# Patient Record
Sex: Female | Born: 1970 | Race: Black or African American | Hispanic: No | Marital: Married | Smoking: Never smoker
Health system: Southern US, Community
[De-identification: ages and names within clinical notes are randomized; demographics above are authoritative.]

## PROBLEM LIST (undated history)

## (undated) DIAGNOSIS — G4730 Sleep apnea, unspecified: Secondary | ICD-10-CM

## (undated) DIAGNOSIS — I2510 Atherosclerotic heart disease of native coronary artery without angina pectoris: Secondary | ICD-10-CM

## (undated) DIAGNOSIS — I10 Essential (primary) hypertension: Secondary | ICD-10-CM

## (undated) HISTORY — PX: TUBAL LIGATION: SHX77

## (undated) HISTORY — PX: DILATION AND CURETTAGE OF UTERUS: SHX78

## (undated) HISTORY — DX: Atherosclerotic heart disease of native coronary artery without angina pectoris: I25.10

---

## 2005-04-29 ENCOUNTER — Ambulatory Visit: Payer: Self-pay

## 2005-10-24 ENCOUNTER — Inpatient Hospital Stay: Payer: Self-pay | Admitting: Obstetrics and Gynecology

## 2010-12-15 ENCOUNTER — Ambulatory Visit: Payer: Self-pay | Admitting: Family Medicine

## 2011-01-20 ENCOUNTER — Emergency Department: Payer: Self-pay | Admitting: Unknown Physician Specialty

## 2012-05-03 ENCOUNTER — Ambulatory Visit: Payer: Self-pay | Admitting: Family Medicine

## 2012-12-02 ENCOUNTER — Emergency Department: Payer: Self-pay | Admitting: Unknown Physician Specialty

## 2013-12-19 ENCOUNTER — Emergency Department: Payer: Self-pay | Admitting: Internal Medicine

## 2014-04-30 ENCOUNTER — Emergency Department: Payer: Self-pay | Admitting: Emergency Medicine

## 2014-04-30 LAB — URINALYSIS, COMPLETE
BILIRUBIN, UR: NEGATIVE
Bacteria: NONE SEEN
Blood: NEGATIVE
Glucose,UR: NEGATIVE mg/dL (ref 0–75)
Ketone: NEGATIVE
LEUKOCYTE ESTERASE: NEGATIVE
NITRITE: NEGATIVE
Ph: 8 (ref 4.5–8.0)
RBC,UR: 4 /HPF (ref 0–5)
Specific Gravity: 1.017 (ref 1.003–1.030)
Squamous Epithelial: <1
WBC UR: 4 /HPF (ref 0–5)

## 2017-10-05 ENCOUNTER — Other Ambulatory Visit: Payer: Self-pay | Admitting: Obstetrics and Gynecology

## 2017-10-05 DIAGNOSIS — Z1231 Encounter for screening mammogram for malignant neoplasm of breast: Secondary | ICD-10-CM

## 2017-10-25 ENCOUNTER — Encounter: Payer: Self-pay | Admitting: Radiology

## 2017-10-25 ENCOUNTER — Ambulatory Visit
Admission: RE | Admit: 2017-10-25 | Discharge: 2017-10-25 | Disposition: A | Discharge status: Home / Self Care | Payer: Managed Care, Other (non HMO) | Source: Ambulatory Visit | Attending: Obstetrics and Gynecology | Admitting: Obstetrics and Gynecology

## 2017-10-25 DIAGNOSIS — Z1231 Encounter for screening mammogram for malignant neoplasm of breast: Secondary | ICD-10-CM | POA: Diagnosis present

## 2021-01-15 ENCOUNTER — Other Ambulatory Visit: Payer: Self-pay | Admitting: Family Medicine

## 2021-01-15 DIAGNOSIS — Z1231 Encounter for screening mammogram for malignant neoplasm of breast: Secondary | ICD-10-CM

## 2021-06-10 ENCOUNTER — Other Ambulatory Visit: Payer: Self-pay

## 2021-06-16 ENCOUNTER — Other Ambulatory Visit: Payer: Self-pay | Admitting: Gerontology

## 2021-10-23 ENCOUNTER — Encounter: Payer: Self-pay | Admitting: *Deleted

## 2021-10-26 ENCOUNTER — Ambulatory Visit: Payer: 59 | Admitting: Registered Nurse

## 2021-10-26 ENCOUNTER — Encounter
Admission: RE | Disposition: A | Discharge status: Home / Self Care | Payer: Self-pay | Source: Home / Self Care | Attending: Gastroenterology

## 2021-10-26 ENCOUNTER — Other Ambulatory Visit: Payer: Self-pay

## 2021-10-26 ENCOUNTER — Ambulatory Visit
Admission: RE | Admit: 2021-10-26 | Discharge: 2021-10-26 | Disposition: A | Discharge status: Home / Self Care | Payer: 59 | Attending: Gastroenterology | Admitting: Gastroenterology

## 2021-10-26 ENCOUNTER — Encounter: Payer: Self-pay | Admitting: *Deleted

## 2021-10-26 DIAGNOSIS — I10 Essential (primary) hypertension: Secondary | ICD-10-CM | POA: Insufficient documentation

## 2021-10-26 DIAGNOSIS — K640 First degree hemorrhoids: Secondary | ICD-10-CM | POA: Diagnosis not present

## 2021-10-26 DIAGNOSIS — Z1211 Encounter for screening for malignant neoplasm of colon: Secondary | ICD-10-CM | POA: Insufficient documentation

## 2021-10-26 DIAGNOSIS — G4730 Sleep apnea, unspecified: Secondary | ICD-10-CM | POA: Diagnosis not present

## 2021-10-26 HISTORY — PX: COLONOSCOPY WITH PROPOFOL: SHX5780

## 2021-10-26 HISTORY — DX: Sleep apnea, unspecified: G47.30

## 2021-10-26 HISTORY — DX: Essential (primary) hypertension: I10

## 2021-10-26 SURGERY — COLONOSCOPY WITH PROPOFOL
Anesthesia: General

## 2021-10-26 MED ORDER — PROPOFOL 500 MG/50ML IV EMUL
INTRAVENOUS | Status: DC | PRN
Start: 2021-10-26 — End: 2021-10-26
  Administered 2021-10-26: 140 ug/kg/min via INTRAVENOUS

## 2021-10-26 MED ORDER — SODIUM CHLORIDE 0.9 % IV SOLN: INTRAVENOUS | Status: DC

## 2021-10-26 MED ORDER — PROPOFOL 10 MG/ML IV BOLUS
INTRAVENOUS | Status: DC | PRN
  Administered 2021-10-26: 20 mg via INTRAVENOUS
  Administered 2021-10-26: 80 mg via INTRAVENOUS

## 2021-10-26 NOTE — Anesthesia Preprocedure Evaluation (Signed)
Anesthesia Evaluation  Patient identified by MRN, date of birth, ID band Patient awake    Reviewed: Allergy & Precautions, H&P , NPO status , Patient's Chart, lab work & pertinent test results, reviewed documented beta blocker date and time   History of Anesthesia Complications Negative for: history of anesthetic complications  Airway Mallampati: III  TM Distance: >3 FB Neck ROM: full    Dental  (+) Dental Advidsory Given, Teeth Intact, Missing   Pulmonary neg shortness of breath, sleep apnea and Continuous Positive Airway Pressure Ventilation , neg COPD, neg recent URI,    Pulmonary exam normal breath sounds clear to auscultation       Cardiovascular Exercise Tolerance: Good hypertension, (-) angina(-) Past MI and (-) Cardiac Stents Normal cardiovascular exam(-) dysrhythmias (-) Valvular Problems/Murmurs Rhythm:regular Rate:Normal     Neuro/Psych negative neurological ROS  negative psych ROS   GI/Hepatic negative GI ROS, Neg liver ROS,   Endo/Other  negative endocrine ROS  Renal/GU negative Renal ROS  negative genitourinary   Musculoskeletal   Abdominal   Peds  Hematology negative hematology ROS (+)   Anesthesia Other Findings Past Medical History: No date: Hypertension No date: Sleep apnea   Reproductive/Obstetrics negative OB ROS                             Anesthesia Physical Anesthesia Plan  ASA: 2  Anesthesia Plan: General   Post-op Pain Management:    Induction: Intravenous  PONV Risk Score and Plan: 3 and Propofol infusion and TIVA  Airway Management Planned: Natural Airway and Nasal Cannula  Additional Equipment:   Intra-op Plan:   Post-operative Plan:   Informed Consent: I have reviewed the patients History and Physical, chart, labs and discussed the procedure including the risks, benefits and alternatives for the proposed anesthesia with the patient or  authorized representative who has indicated his/her understanding and acceptance.     Dental Advisory Given  Plan Discussed with: Anesthesiologist, CRNA and Surgeon  Anesthesia Plan Comments:         Anesthesia Quick Evaluation

## 2021-10-26 NOTE — Op Note (Signed)
[facility] Gastroenterology Patient Name: Laura Rodgers Procedure Date: 10/26/2021 10:18 AM MRN: [id number] Account #: [id number] Date of Birth: Jun 30, 1971 Admit Type: Outpatient Age: 51 Room: [facility] ENDO ROOM 3 Gender: Female Note Status: Finalized Instrument Name: Jasper Riling [id number] Procedure:             Colonoscopy Indications:           Screening for colorectal malignant neoplasm Providers:             Andrey Farmer MD, MD Referring MD:          Caprice Renshaw MD (Referring MD) Medicines:             Monitored Anesthesia Care Complications:         No immediate complications. Procedure:             Pre-Anesthesia Assessment:                        - Prior to the procedure, a History and Physical was                         performed, and patient medications and allergies were                         reviewed. The patient is competent. The risks and                         benefits of the procedure and the sedation options and                         risks were discussed with the patient. All questions                         were answered and informed consent was obtained.                         Patient identification and proposed procedure were                         verified by the physician, the nurse, the                         anesthesiologist, the anesthetist and the technician                         in the endoscopy suite. Mental Status Examination:                         alert and oriented. Airway Examination: normal                         oropharyngeal airway and neck mobility. Respiratory                         Examination: clear to auscultation. CV Examination:                         normal. Prophylactic Antibiotics: The patient does not  require prophylactic antibiotics. Prior                         Anticoagulants: The patient has taken no previous                         anticoagulant or antiplatelet  agents. ASA Grade                         Assessment: II - A patient with mild systemic disease.                         After reviewing the risks and benefits, the patient                         was deemed in satisfactory condition to undergo the                         procedure. The anesthesia plan was to use monitored                         anesthesia care (MAC). Immediately prior to                         administration of medications, the patient was                         re-assessed for adequacy to receive sedatives. The                         heart rate, respiratory rate, oxygen saturations,                         blood pressure, adequacy of pulmonary ventilation, and                         response to care were monitored throughout the                         procedure. The physical status of the patient was                         re-assessed after the procedure.                        After obtaining informed consent, the colonoscope was                         passed under direct vision. Throughout the procedure,                         the patient's blood pressure, pulse, and oxygen                         saturations were monitored continuously. The                         Colonoscope was introduced through the anus and  advanced to the the cecum, identified by appendiceal                         orifice and ileocecal valve. The colonoscopy was                         performed without difficulty. The patient tolerated                         the procedure well. The quality of the bowel                         preparation was good. Findings:      The perianal and digital rectal examinations were normal.      Internal hemorrhoids were found during retroflexion. The hemorrhoids       were Grade I (internal hemorrhoids that do not prolapse).      The exam was otherwise without abnormality on direct and retroflexion       views. Impression:             - Internal hemorrhoids.                        - The examination was otherwise normal on direct and                         retroflexion views.                        - No specimens collected. Recommendation:        - Discharge patient to home.                        - Resume previous diet.                        - Continue present medications.                        - Repeat colonoscopy in 10 years for screening                         purposes.                        - Return to referring physician as previously                         scheduled. Procedure Code(s):     --- Professional ---                        S2876, Colorectal cancer screening; colonoscopy on                         individual not meeting criteria for high risk Diagnosis Code(s):     --- Professional ---                        Z12.11, Encounter for screening for malignant neoplasm  of colon                        K64.0, First degree hemorrhoids CPT copyright 2019 [employer]. All rights reserved. The codes documented in this report are preliminary and upon coder review may  be revised to meet current compliance requirements. Andrey Farmer MD, MD 10/26/2021 10:58:05 AM Number of Addenda: 0 Note Initiated On: 10/26/2021 10:18 AM Scope Withdrawal Time: 0 hours 9 minutes 53 seconds  Total Procedure Duration: 0 hours 16 minutes 36 seconds  Estimated Blood Loss:  Estimated blood loss: none.      [facility]

## 2021-10-26 NOTE — Transfer of Care (Signed)
Immediate Anesthesia Transfer of Care Note  Patient: Laura Rodgers  Procedure(s) Performed: COLONOSCOPY WITH PROPOFOL  Patient Location: PACU and Endoscopy Unit  Anesthesia Type:General  Level of Consciousness: drowsy and patient cooperative  Airway & Oxygen Therapy: Patient Spontanous Breathing  Post-op Assessment: Report given to RN and Post -op Vital signs reviewed and stable  Post vital signs: Reviewed and stable  Last Vitals:  Vitals Value Taken Time  BP 123/79 10/26/21 1100  Temp 36 C 10/26/21 1100  Pulse 78 10/26/21 1100  Resp 17 10/26/21 1100  SpO2 100 % 10/26/21 1100    Last Pain:  Vitals:   10/26/21 1100  TempSrc: Temporal  PainSc:          Complications: No notable events documented.

## 2021-10-26 NOTE — Anesthesia Postprocedure Evaluation (Signed)
Anesthesia Post Note  Patient: Laura Rodgers  Procedure(s) Performed: COLONOSCOPY WITH PROPOFOL  Patient location during evaluation: Endoscopy Anesthesia Type: General Level of consciousness: awake and alert Pain management: pain level controlled Vital Signs Assessment: post-procedure vital signs reviewed and stable Respiratory status: spontaneous breathing, nonlabored ventilation, respiratory function stable and patient connected to nasal cannula oxygen Cardiovascular status: blood pressure returned to baseline and stable Postop Assessment: no apparent nausea or vomiting Anesthetic complications: no   No notable events documented.   Last Vitals:  Vitals:   10/26/21 1120 10/26/21 1130  BP: 124/88 131/88  Pulse:  76  Resp: 16 16  Temp: (!) 36 C   SpO2: 100% 100%    Last Pain:  Vitals:   10/26/21 1120  TempSrc: Temporal  PainSc:                  Lenard Simmer

## 2021-10-26 NOTE — H&P (Signed)
Outpatient short stay form Pre-procedure 10/26/2021  Regis Bill, MD  Primary Physician: Kandyce Rud, MD  Reason for visit:  Screening colonoscopy  History of present illness:    51 y/o lady with history of hypertension here for screening colonoscopy. No family history of GI malignancies. No blood thinners. No significant abdominal surgeries.    Current Facility-Administered Medications:    0.9 %  sodium chloride infusion, , Intravenous, Continuous, Muaz Shorey, Rossie Muskrat, MD, Last Rate: 20 mL/hr at 10/26/21 1016, Continued from Pre-op at 10/26/21 1016  Medications Prior to Admission  Medication Sig Dispense Refill Last Dose   hydrochlorothiazide (HYDRODIURIL) 12.5 MG tablet Take 12.5 mg by mouth daily.   10/26/2021 at 0500   losartan (COZAAR) 50 MG tablet Take 50 mg by mouth daily.   10/26/2021 at 0500     Not on File   Past Medical History:  Diagnosis Date   Hypertension    Sleep apnea     Review of systems:  Otherwise negative.    Physical Exam  Gen: Alert, oriented. Appears stated age.  HEENT:PERRLA. Lungs: No respiratory distress CV: RRR Abd: soft, benign, no masses Ext: No edema    Planned procedures: Proceed with colonoscopy. The patient understands the nature of the planned procedure, indications, risks, alternatives and potential complications including but not limited to bleeding, infection, perforation, damage to internal organs and possible oversedation/side effects from anesthesia. The patient agrees and gives consent to proceed.  Please refer to procedure notes for findings, recommendations and patient disposition/instructions.     Regis Bill, MD [facility] Gastroenterology

## 2021-10-26 NOTE — Interval H&P Note (Signed)
History and Physical Interval Note:  10/26/2021 10:31 AM  Laura Rodgers  has presented today for surgery, with the diagnosis of colon cancer screening.  The various methods of treatment have been discussed with the patient and family. After consideration of risks, benefits and other options for treatment, the patient has consented to  Procedure(s): COLONOSCOPY WITH PROPOFOL (N/A) as a surgical intervention.  The patient's history has been reviewed, patient examined, no change in status, stable for surgery.  I have reviewed the patient's chart and labs.  Questions were answered to the patient's satisfaction.     Regis Bill  Ok to proceed with colonoscopy

## 2021-11-10 ENCOUNTER — Other Ambulatory Visit: Payer: Self-pay

## 2021-11-18 ENCOUNTER — Other Ambulatory Visit: Payer: Self-pay

## 2021-11-18 ENCOUNTER — Ambulatory Visit
Admission: RE | Admit: 2021-11-18 | Discharge: 2021-11-18 | Disposition: A | Discharge status: Home / Self Care | Payer: 59 | Source: Ambulatory Visit | Attending: Family Medicine | Admitting: Family Medicine

## 2021-11-18 DIAGNOSIS — Z1231 Encounter for screening mammogram for malignant neoplasm of breast: Secondary | ICD-10-CM | POA: Insufficient documentation

## 2022-01-08 ENCOUNTER — Other Ambulatory Visit: Payer: Self-pay

## 2022-03-03 ENCOUNTER — Other Ambulatory Visit: Payer: Self-pay

## 2022-06-03 ENCOUNTER — Other Ambulatory Visit: Payer: Self-pay

## 2022-09-29 IMAGING — MG MM DIGITAL SCREENING BILAT W/ TOMO AND CAD
8 series · 8 of 24 positions shown · non-contrast
Comparison: Previous exam(s).

CLINICAL DATA: Screening.

EXAM:
DIGITAL SCREENING BILATERAL MAMMOGRAM WITH TOMOSYNTHESIS AND CAD
TECHNIQUE: Bilateral screening digital craniocaudal and mediolateral oblique
mammograms were obtained. Bilateral screening digital breast
tomosynthesis was performed. The images were evaluated with
computer-aided detection.

[R CC synth-2D]
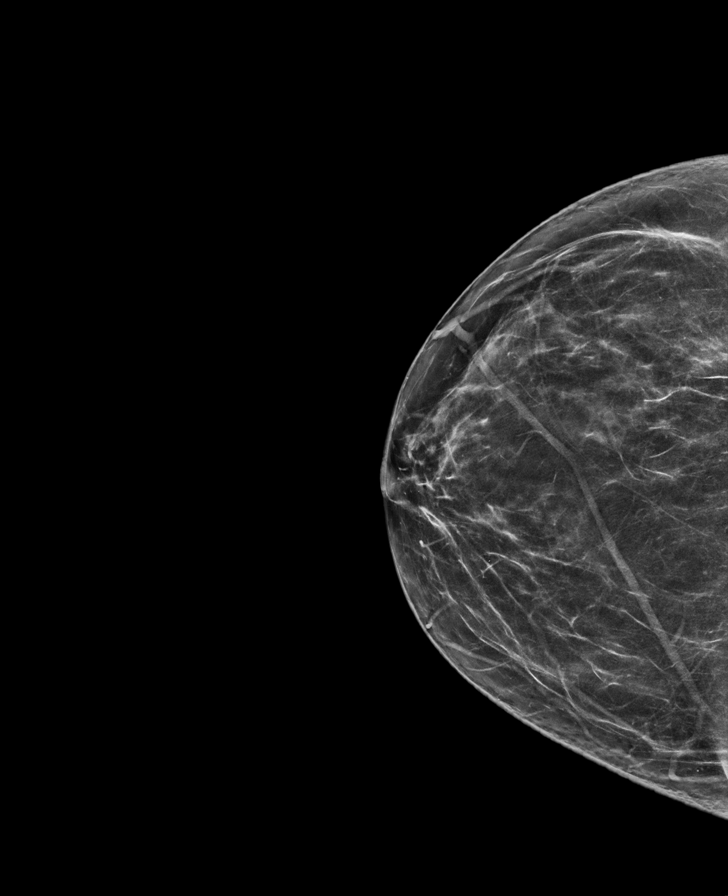

[L MLO synth-2D]
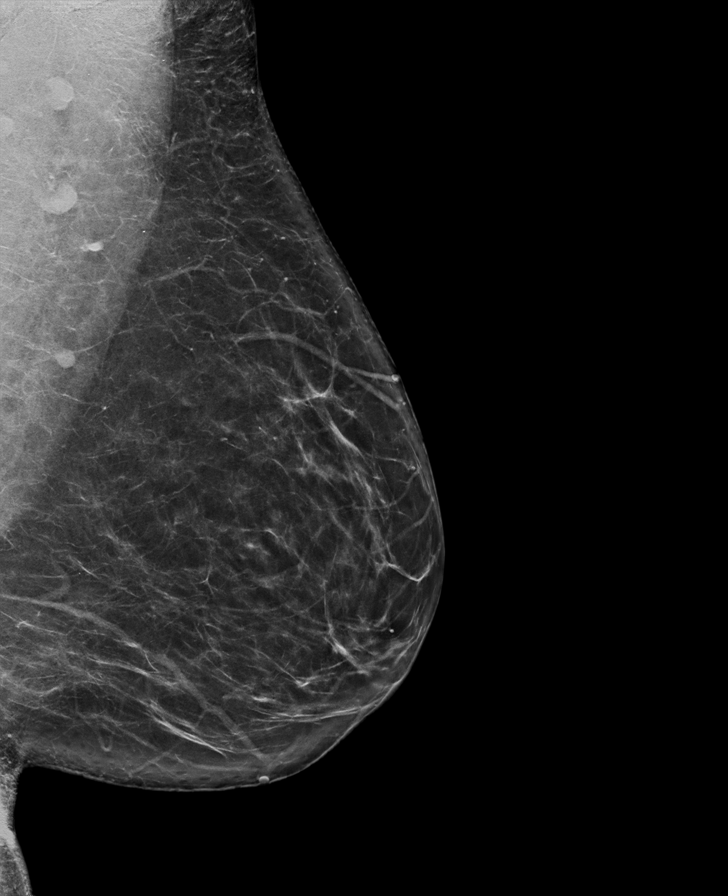

[R MLO synth-2D]
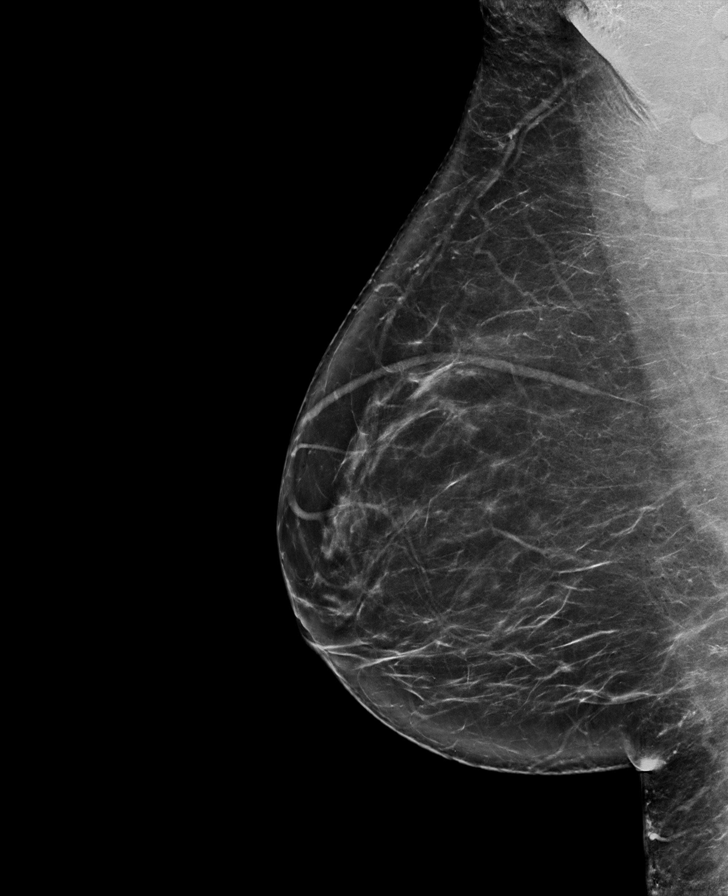

[L CC synth-2D]
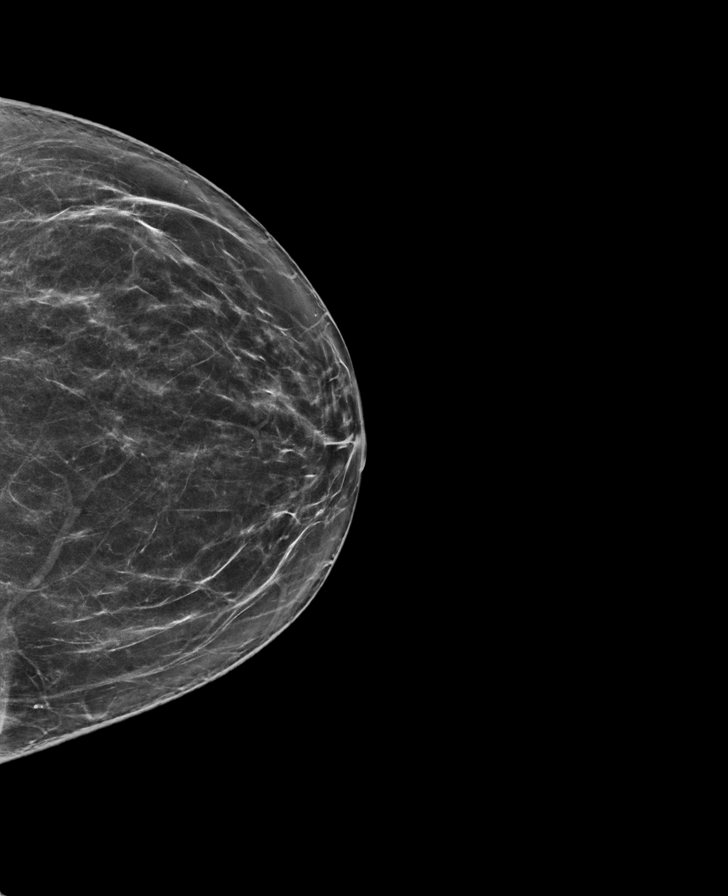

[R CC tomo · tomo slice 35/70.0]
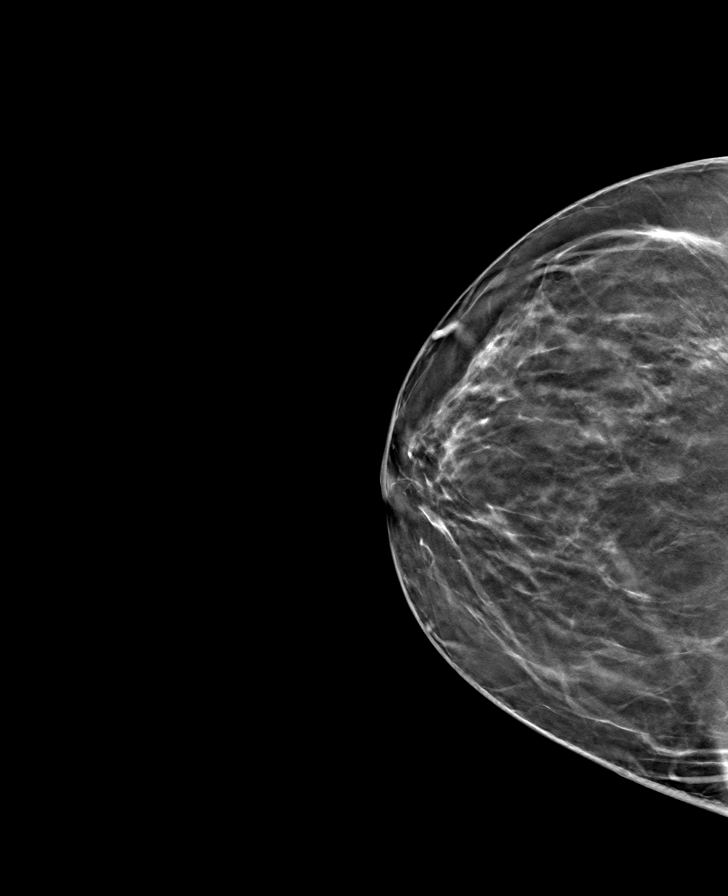

[L CC tomo · tomo slice 39/77.0]
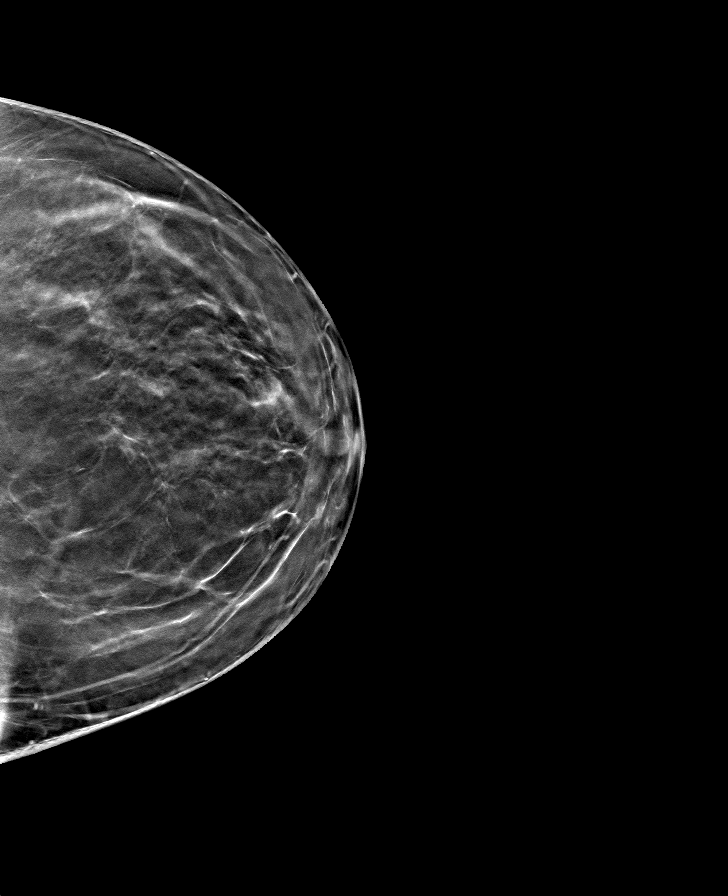

[L MLO tomo · tomo slice 43/86.0]
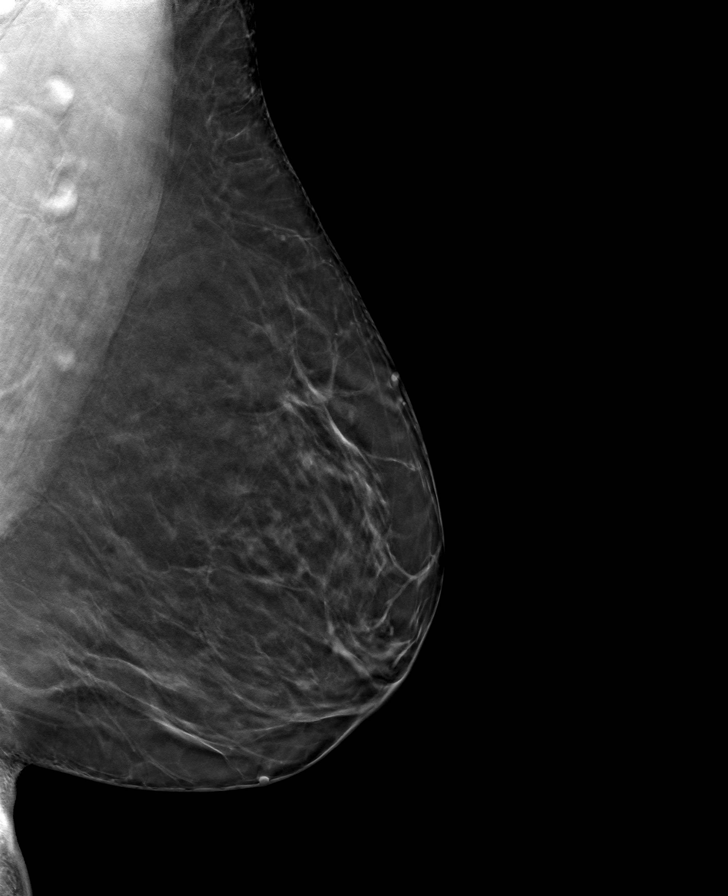

[R MLO tomo · tomo slice 46/91.0]
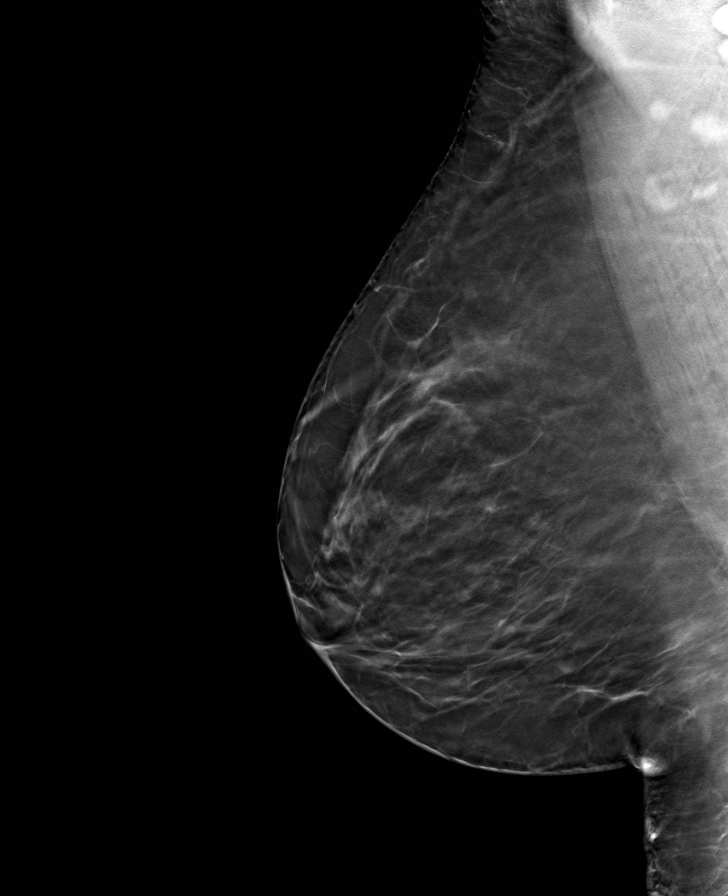

[8 of 24 positions shown; findings below may reference images not displayed]

ACR Breast Density Category b: There are scattered areas of
fibroglandular density.
FINDINGS: There are no findings suspicious for malignancy.
IMPRESSION: No mammographic evidence of malignancy. A result letter of this
screening mammogram will be mailed directly to the patient.

RECOMMENDATION:
Screening mammogram in one year. (Code:51-O-LD2)

BI-RADS CATEGORY  1: Negative.

## 2023-02-18 ENCOUNTER — Emergency Department
Admission: EM | Admit: 2023-02-18 | Discharge: 2023-02-18 | Disposition: A | Discharge status: Home / Self Care | Payer: 59 | Attending: Emergency Medicine | Admitting: Emergency Medicine

## 2023-02-18 ENCOUNTER — Emergency Department: Payer: 59

## 2023-02-18 DIAGNOSIS — E876 Hypokalemia: Secondary | ICD-10-CM | POA: Insufficient documentation

## 2023-02-18 DIAGNOSIS — R0789 Other chest pain: Secondary | ICD-10-CM | POA: Diagnosis present

## 2023-02-18 DIAGNOSIS — R079 Chest pain, unspecified: Secondary | ICD-10-CM | POA: Insufficient documentation

## 2023-02-18 LAB — BASIC METABOLIC PANEL
Anion gap: 11 (ref 5–15)
BUN: 14 mg/dL (ref 6–20)
CO2: 27 mmol/L (ref 22–32)
Calcium: 9.2 mg/dL (ref 8.9–10.3)
Chloride: 101 mmol/L (ref 98–111)
Creatinine, Ser: 0.97 mg/dL (ref 0.44–1.00)
GFR, Estimated: >60 mL/min (ref >60)
Glucose, Bld: 89 mg/dL (ref 70–99)
Potassium: 3.2 mmol/L — ABNORMAL LOW (ref 3.5–5.1)
Sodium: 139 mmol/L (ref 135–145)

## 2023-02-18 LAB — CBC
HCT: 39.8 % (ref 36.0–46.0)
Hemoglobin: 13.2 g/dL (ref 12.0–15.0)
MCH: 28.6 pg (ref 26.0–34.0)
MCHC: 33.2 g/dL (ref 30.0–36.0)
MCV: 86.1 fL (ref 80.0–100.0)
Platelets: 280 10*3/uL (ref 150–400)
RBC: 4.62 MIL/uL (ref 3.87–5.11)
RDW: 12.9 % (ref 11.5–15.5)
WBC: 5.7 10*3/uL (ref 4.0–10.5)
nRBC: 0 % (ref 0.0–0.2)

## 2023-02-18 LAB — TROPONIN I (HIGH SENSITIVITY)
Troponin I (High Sensitivity): 4 ng/L (ref <18)
Troponin I (High Sensitivity): 4 ng/L (ref <18)

## 2023-02-18 MED ORDER — POTASSIUM CHLORIDE CRYS ER 20 MEQ PO TBCR
40.0000 meq | EXTENDED_RELEASE_TABLET | Freq: Once | ORAL | Status: AC
  Administered 2023-02-18: 40 meq via ORAL
  Filled 2023-02-18: qty 2

## 2023-02-18 NOTE — ED Notes (Signed)
EDP Nash Dimmer. at bedside with [place].

## 2023-02-18 NOTE — ED Provider Notes (Addendum)
[facility] Provider Note    Event Date/Time   First MD Initiated Contact with Patient 02/18/23 1243     (approximate)   History   Chest Pain   HPI  Laura Rodgers is a 52 y.o. female   Past medical history of hypertension [place] to the emergency department with chest tightness left-sided occasionally radiates to her left arm.  This has been happening over the last several weeks since the death of her husband last month.  Usually worsens with psychological stress thinking about his recent death.  Nonexertional.  No associated shortness of breath.  No respiratory infectious symptoms.  No history of CAD, compliant with hypertensive medications.  Independent Historian contributed to assessment above: Daughter and son are at bedside corroborate information given above.       Physical Exam   Triage Vital Signs: ED Triage Vitals  Enc Vitals Group     BP 02/18/23 1146 (!) 139/91     Pulse Rate 02/18/23 1146 87     Resp 02/18/23 1146 17     Temp 02/18/23 1146 98.5 F (36.9 C)     Temp Source 02/18/23 1146 Oral     SpO2 02/18/23 1146 99 %     Weight 02/18/23 1149 218 lb (98.9 kg)     Height 02/18/23 1149 5\' 7"  (1.702 m)     Head Circumference --      Peak Flow --      Pain Score 02/18/23 1149 4     Pain Loc --      Pain Edu? --      Excl. in GC? --     Most recent vital signs: Vitals:   02/18/23 1146 02/18/23 1244  BP: (!) 139/91 (!) 151/94  Pulse: 87 82  Resp: 17 18  Temp: 98.5 F (36.9 C)   SpO2: 99% 99%    General: Awake, no distress.  CV:  Good peripheral perfusion. Resp:  Normal effort.  Abd:  No distention.  Other:  Awake alert comfortable mildly hypertensive otherwise vital signs within normal limits.  Radial pulses intact and equal bilaterally.  Lung sounds clear to auscultation bilaterally.  No heart murmurs.  Skin is well perfused, warm, euvolemic appearing.   ED Results / Procedures / Treatments   Labs (all labs ordered are  listed, but only abnormal results are displayed) Labs Reviewed  BASIC METABOLIC PANEL - Abnormal; Notable for the following components:      Result Value   Potassium 3.2 (*)    All other components within normal limits  CBC  TROPONIN I (HIGH SENSITIVITY)  TROPONIN I (HIGH SENSITIVITY)     I ordered and reviewed the above labs they are notable for K slightly low at 3.2, initial troponin is normal.  EKG  ED ECG REPORT I, Pilar Jarvis, the attending physician, personally viewed and interpreted this ECG.   Date: 02/18/2023  EKG Time: 1149  Rate: 84  Rhythm: nsr  Axis: nl  Intervals:none  ST&T Change: no stemi    RADIOLOGY I independently reviewed and interpreted chest x-ray and I see no obvious pneumothorax or focality   PROCEDURES:  Critical Care performed: No  Procedures   MEDICATIONS ORDERED IN ED: Medications  potassium chloride SA (KLOR-CON M) CR tablet 40 mEq (40 mEq Oral Given 02/18/23 1251)    IMPRESSION / MDM / ASSESSMENT AND PLAN / ED COURSE  I reviewed the triage vital signs and the nursing notes.  Patient's presentation is most consistent with acute presentation with potential threat to life or bodily function.  Differential diagnosis includes, but is not limited to, ACS, PE, dissection, Takotsubo cardiomyopathy, stress related   MDM: Nonspecific chest pain nonexertional in the setting of the stress of losing a loved one recently.  I am concerned for ACS or cardiomyopathy given her presentation and symptoms.   Fortunately EKG is nonischemic initial troponin is negative, no signs of heart failure, no associated shortness of breath and vital signs unremarkable aside from mild hypertension, a bedside ultrasound showed no gross abnormalities to the heart, good ejection fraction, no obvious dilation of the ventricles, no focal wall motion abnormality- I doubt this is cardiopulmonary emergency like ACS, dissection, PE or  cardiomyopathy.    Will follow-up with second troponin and if normal, will discharge and give cardiology referral.       FINAL CLINICAL IMPRESSION(S) / ED DIAGNOSES   Final diagnoses:  Nonspecific chest pain  Hypokalemia     Rx / DC Orders   ED Discharge Orders          Ordered    Ambulatory referral to Cardiology        02/18/23 1306             Note:  This document was prepared using Dragon voice recognition software and may include unintentional dictation errors.    Pilar Jarvis, MD 02/18/23 1306    Pilar Jarvis, MD 02/18/23 1426

## 2023-02-18 NOTE — Discharge Instructions (Addendum)
Fortunately, today's testing did not show any signs of  heart attack.  Call your doctor for a follow-up appointment this week to recheck on your symptoms.   I made a doctor to doctor referral to cardiology who will reach out to you to schedule a follow-up appointment to further evaluate your chest pain.   If you experience any new, worsening, or unexpected symptoms call your doctor right away or come back to the emergency department for reevaluation.

## 2023-02-18 NOTE — ED Triage Notes (Signed)
Pt c/o CP x2 weeks that comes and goes. Pt recently loss her husband and thinks it is anxiety related. Pt reports having hypertension. Pt denies any other related symptoms

## 2023-02-18 NOTE — ED Triage Notes (Signed)
First Nurse Note;  Pt via POV from [facility]. Pt c/o chest tightness, L arm tingling, for the past 2-3 weeks. Pt recently lost her spouse. VSS.

## 2023-02-18 NOTE — ED Notes (Addendum)
EDP [place] Charon to bedside. See triage note. Pt lost husband last month. States he woke up with CP and 30 minutes later he died. It was unexpected per pt. Daughter at bedside. Pt reports no changes to medications recently and has been taking her BP med regularly. Pt's resp reg/unlabored, skin dry and calm currently. Reports "it may be from anxiety bc of my husband". Pt states yesterday had brief numbness in L arm.

## 2023-02-18 NOTE — ED Notes (Signed)
Pt up to restroom. Steady.  

## 2023-02-23 ENCOUNTER — Ambulatory Visit: Payer: 59 | Attending: Cardiology | Admitting: Cardiology

## 2023-02-23 ENCOUNTER — Encounter: Payer: Self-pay | Admitting: Cardiology

## 2023-02-23 VITALS — BP 124/80 | HR 77 | Ht 67.0 in | Wt 218.8 lb

## 2023-02-23 DIAGNOSIS — R072 Precordial pain: Secondary | ICD-10-CM | POA: Diagnosis not present

## 2023-02-23 DIAGNOSIS — I10 Essential (primary) hypertension: Secondary | ICD-10-CM

## 2023-02-23 MED ORDER — METOPROLOL TARTRATE 100 MG PO TABS: 100.0000 mg | ORAL_TABLET | Freq: Once | ORAL | 0 refills | Status: DC

## 2023-02-23 NOTE — Progress Notes (Signed)
Cardiology Office Note:    Date:  02/23/2023   ID:  Laura Rodgers, DOB 1971-01-30, MRN [id number]  PCP:  Kandyce Rud, MD    [facility]Care Providers Cardiologist:  Debbe Odea, MD     Referring MD: Pilar Jarvis, MD   Chief Complaint  Patient presents with   New Patient (Initial Visit)    Evaluated in ED on 02/18/23 with nonspecific chest pain.  Believes the chest pains were due to emotional stress with loss of husband.     Laura Rodgers is a 52 y.o. female who is being seen today for the evaluation of chest pain at the request of Pilar Jarvis, MD.   History of Present Illness:    Laura Rodgers is a 52 y.o. female with a hx of hypertension who presents due to chest pain.  Symptoms of chest pain occurred 2 to 3 weeks ago lasting about 3 days.  Patient lost her husband about a month ago and thinks symptoms might be anxiety related.  She presented to the ED 5 days ago where workup with EKG and troponins were unrevealing.  Mother had a stent to her leg in her 76s.  Past Medical History:  Diagnosis Date   Hypertension    Sleep apnea     Past Surgical History:  Procedure Laterality Date   COLONOSCOPY WITH PROPOFOL N/A 10/26/2021   Procedure: COLONOSCOPY WITH PROPOFOL;  Surgeon: Regis Bill, MD;  Location: [facility] ENDOSCOPY;  Service: Endoscopy;  Laterality: N/A;   DILATION AND CURETTAGE OF UTERUS     TUBAL LIGATION      Current Medications: Current Meds  Medication Sig   hydrochlorothiazide (HYDRODIURIL) 12.5 MG tablet Take 12.5 mg by mouth daily.   losartan (COZAAR) 50 MG tablet Take 50 mg by mouth daily.   metoprolol tartrate (LOPRESSOR) 100 MG tablet Take 1 tablet (100 mg total) by mouth once for 1 dose. TAKE 2 HOURS PRIOR TO CARDIAC CT     Allergies:   Patient has no known allergies.   Social History   Socioeconomic History   Marital status: Married    Spouse name: Not on file   Number of children: Not on file   Years of education: Not on file    Highest education level: Not on file  Occupational History   Not on file  Tobacco Use   Smoking status: Never   Smokeless tobacco: Never  Vaping Use   Vaping Use: Never used  Substance and Sexual Activity   Alcohol use: Never   Drug use: Never   Sexual activity: Not on file  Other Topics Concern   Not on file  Social History Narrative   Not on file   Social Determinants of Health   Financial Resource Strain: Not on file  Food Insecurity: Not on file  Transportation Needs: Not on file  Physical Activity: Not on file  Stress: Not on file  Social Connections: Not on file     Family History: The patient's family history includes Heart failure in her mother; Hypertension in her father, mother, and sister. There is no history of Breast cancer.  ROS:   Please see the history of present illness.     All other systems reviewed and are negative.  EKGs/Labs/Other Studies Reviewed:    The following studies were reviewed today:       Recent Labs: 02/18/2023: BUN 14; Creatinine, Ser 0.97; Hemoglobin 13.2; Platelets 280; Potassium 3.2; Sodium 139  Recent Lipid Panel No results found for: "CHOL", "  TRIG", "HDL", "CHOLHDL", "VLDL", "LDLCALC", "LDLDIRECT"   Risk Assessment/Calculations:             Physical Exam:    VS:  BP 124/80 (BP Location: Right Arm, Patient Position: Sitting, Cuff Size: Large)   Pulse 77   Ht 5\' 7"  (1.702 m)   Wt 218 lb 12.8 oz (99.2 kg)   LMP  (LMP Unknown)   SpO2 99%   BMI 34.27 kg/m     Wt Readings from Last 3 Encounters:  02/23/23 218 lb 12.8 oz (99.2 kg)  02/18/23 218 lb (98.9 kg)  10/26/21 220 lb (99.8 kg)     GEN:  Well nourished, well developed in no acute distress HEENT: Normal NECK: No JVD; No carotid bruits CARDIAC: RRR, no murmurs, rubs, gallops RESPIRATORY:  Clear to auscultation without rales, wheezing or rhonchi  ABDOMEN: Soft, non-tender, non-distended MUSCULOSKELETAL:  No edema; No deformity  SKIN: Warm and  dry NEUROLOGIC:  Alert and oriented x 3 PSYCHIATRIC:  Normal affect   ASSESSMENT:    1. Precordial pain   2. Primary hypertension    PLAN:    In order of problems listed above:  Chest pain, risk factors hypertension.  Get echo, get coronary CTA. Hypertension, BP controlled.  Losartan 50 mg daily, HCTZ 12.5 mg daily.  Follow-up after echo and coronary CT      Medication Adjustments/Labs and Tests Ordered: Current medicines are reviewed at length with the patient today.  Concerns regarding medicines are outlined above.  Orders Placed This Encounter  Procedures   CT CORONARY MORPH W/CTA COR W/SCORE W/CA W/CM &/OR WO/CM   EKG 12-Lead   ECHOCARDIOGRAM COMPLETE   Meds ordered this encounter  Medications   metoprolol tartrate (LOPRESSOR) 100 MG tablet    Sig: Take 1 tablet (100 mg total) by mouth once for 1 dose. TAKE 2 HOURS PRIOR TO CARDIAC CT    Dispense:  1 tablet    Refill:  0    Patient Instructions  Medication Instructions:   Your physician recommends that you continue on your current medications as directed. Please refer to the Current Medication list given to you today.  *If you need a refill on your cardiac medications before your next appointment, please call your pharmacy*   Lab Work:  None Ordered  If you have labs (blood work) drawn today and your tests are completely normal, you will receive your results only by: MyChart Message (if you have MyChart) OR A paper copy in the mail If you have any lab test that is abnormal or we need to change your treatment, we will call you to review the results.   Testing/Procedures:  Your physician has requested that you have an echocardiogram. Echocardiography is a painless test that uses sound waves to create images of your heart. It provides your doctor with information about the size and shape of your heart and how well your heart's chambers and valves are working. This procedure takes approximately one hour. There  are no restrictions for this procedure. Please do NOT wear cologne, perfume, aftershave, or lotions (deodorant is allowed). Please arrive 15 minutes prior to your appointment time.    Your cardiac CT will be scheduled at one of the below locations:   [facility] [address] [place], [place] [address] [phone]  OR   [facility] [address] [place], [place] [address] [phone]  If scheduled at [facility] or [facility], please arrive 15 mins early for check-in and test prep.   Please follow these instructions carefully (unless otherwise directed):  On the Night Before the Test: Be sure to Drink plenty of water. Do not consume any caffeinated/decaffeinated beverages or chocolate 12 hours prior to your test. Do not take any antihistamines 12 hours prior to your test.  On the Day of the Test: Drink plenty of water until 1 hour prior to the test. Do not eat any food 1 hour prior to test. You may take your regular medications prior to the test.  Take metoprolol (Lopressor) two hours prior to test. If you take Hydrochlorothiazide, please HOLD on the morning of the test. FEMALES- please wear underwire-free bra if available, avoid dresses & tight clothing  After the Test: Drink plenty of water. After receiving IV contrast, you may experience a mild flushed feeling. This is normal. On occasion, you may experience a mild rash up to 24 hours after the test. This is not dangerous. If this occurs, you can take Benadryl 25 mg and increase your fluid intake. If you experience trouble breathing, this can be serious. If it is severe call 911 IMMEDIATELY. If it is mild, please call our office.   For scheduling needs, including cancellations and rescheduling, please call [place], [phone].   Follow-Up: At [facility], you and your health needs  are our priority.  As part of our continuing mission to provide you with exceptional heart care, we have created designated Provider Care Teams.  These Care Teams include your primary Cardiologist (physician) and Advanced Practice Providers (APPs -  Physician Assistants and Nurse Practitioners) who all work together to provide you with the care you need, when you need it.  We recommend signing up for the patient portal called "MyChart".  Sign up information is provided on this After Visit Summary.  MyChart is used to connect with patients for Virtual Visits (Telemedicine).  Patients are able to view lab/test results, encounter notes, upcoming appointments, etc.  Non-urgent messages can be sent to your provider as well.   To learn more about what you can do with MyChart, go to [email or web address].    Your next appointment:    After testing  Provider:   You may see Debbe Odea, MD or one of the following Advanced Practice Providers on your designated Care Team:   Nicolasa Ducking, NP Eula Listen, PA-C Cadence Fransico Michael, PA-C Charlsie Quest, NP   Signed, Debbe Odea, MD  02/23/2023 9:58 AM    [facility]

## 2023-02-23 NOTE — Patient Instructions (Signed)
Medication Instructions:   Your physician recommends that you continue on your current medications as directed. Please refer to the Current Medication list given to you today.  *If you need a refill on your cardiac medications before your next appointment, please call your pharmacy*   Lab Work:  None Ordered  If you have labs (blood work) drawn today and your tests are completely normal, you will receive your results only by: MyChart Message (if you have MyChart) OR A paper copy in the mail If you have any lab test that is abnormal or we need to change your treatment, we will call you to review the results.   Testing/Procedures:  Your physician has requested that you have an echocardiogram. Echocardiography is a painless test that uses sound waves to create images of your heart. It provides your doctor with information about the size and shape of your heart and how well your heart's chambers and valves are working. This procedure takes approximately one hour. There are no restrictions for this procedure. Please do NOT wear cologne, perfume, aftershave, or lotions (deodorant is allowed). Please arrive 15 minutes prior to your appointment time.    Your cardiac CT will be scheduled at one of the below locations:   [facility] [address] [place], [place] [address] [phone]  OR   [facility] [address] [place], [place] [address] [phone]  If scheduled at [facility] or [facility], please arrive 15 mins early for check-in and test prep.   Please follow these instructions carefully (unless otherwise directed):  On the Night Before the Test: Be sure to Drink plenty of water. Do not consume any caffeinated/decaffeinated beverages or chocolate 12 hours prior to your test. Do not take any antihistamines 12 hours prior to your test.  On the Day of  the Test: Drink plenty of water until 1 hour prior to the test. Do not eat any food 1 hour prior to test. You may take your regular medications prior to the test.  Take metoprolol (Lopressor) two hours prior to test. If you take Hydrochlorothiazide, please HOLD on the morning of the test. FEMALES- please wear underwire-free bra if available, avoid dresses & tight clothing  After the Test: Drink plenty of water. After receiving IV contrast, you may experience a mild flushed feeling. This is normal. On occasion, you may experience a mild rash up to 24 hours after the test. This is not dangerous. If this occurs, you can take Benadryl 25 mg and increase your fluid intake. If you experience trouble breathing, this can be serious. If it is severe call 911 IMMEDIATELY. If it is mild, please call our office.   For scheduling needs, including cancellations and rescheduling, please call [place], [phone].   Follow-Up: At [facility], you and your health needs are our priority.  As part of our continuing mission to provide you with exceptional heart care, we have created designated Provider Care Teams.  These Care Teams include your primary Cardiologist (physician) and Advanced Practice Providers (APPs -  Physician Assistants and Nurse Practitioners) who all work together to provide you with the care you need, when you need it.  We recommend signing up for the patient portal called "MyChart".  Sign up information is provided on this After Visit Summary.  MyChart is used to connect with patients for Virtual Visits (Telemedicine).  Patients are able to view lab/test results, encounter notes, upcoming  appointments, etc.  Non-urgent messages can be sent to your provider as well.   To learn more about what you can do with MyChart, go to [email or web address].    Your next appointment:    After testing  Provider:   You may see Debbe Odea, MD or one of the following Advanced  Practice Providers on your designated Care Team:   Nicolasa Ducking, NP Eula Listen, PA-C Cadence Fransico Michael, PA-C Charlsie Quest, NP

## 2023-03-01 ENCOUNTER — Ambulatory Visit: Payer: 59 | Attending: Cardiology

## 2023-03-01 DIAGNOSIS — I361 Nonrheumatic tricuspid (valve) insufficiency: Secondary | ICD-10-CM

## 2023-03-01 DIAGNOSIS — R072 Precordial pain: Secondary | ICD-10-CM

## 2023-03-01 LAB — ECHOCARDIOGRAM COMPLETE
AR max vel: 2.64 cm2
AV Area VTI: 2.66 cm2
AV Area mean vel: 2.63 cm2
AV Mean grad: 5.5 mmHg
AV Peak grad: 9.4 mmHg
Ao pk vel: 1.54 m/s
Area-P 1/2: 3.6 cm2
Calc EF: 56.6 %
S' Lateral: 2.7 cm
Single Plane A2C EF: 54.9 %
Single Plane A4C EF: 57.1 %

## 2023-03-14 ENCOUNTER — Encounter: Payer: Self-pay | Admitting: Medical

## 2023-03-14 ENCOUNTER — Ambulatory Visit: Payer: 59 | Attending: Medical | Admitting: Medical

## 2023-03-14 VITALS — BP 120/90 | HR 69 | Ht 67.0 in | Wt 220.4 lb

## 2023-03-14 DIAGNOSIS — E876 Hypokalemia: Secondary | ICD-10-CM | POA: Diagnosis not present

## 2023-03-14 DIAGNOSIS — I10 Essential (primary) hypertension: Secondary | ICD-10-CM

## 2023-03-14 DIAGNOSIS — R072 Precordial pain: Secondary | ICD-10-CM

## 2023-03-14 NOTE — Progress Notes (Signed)
Cardiology Office Note:    Date:  03/14/2023   ID:  Laura Rodgers, DOB 1971-03-30, MRN [id number]  PCP:  Kandyce Rud, MD  [facility] Cardiologist:  Debbe Odea, MD  [facility] Electrophysiologist:  None   Referring MD: Kandyce Rud, MD   Chief Complaint: 1 month follow-up  History of Present Illness:    Laura Rodgers is a 52 y.o. female with a hx of hypertension who presents for chest pain follow-up.  Patient was seen in June 2024 as a new patient for chest pain.  Patient was previously evaluated in the ED 02/18/2023 with nonspecific chest pain, believe the chest pain was due to emotional stress with loss of husband.  Workup was negative.  Echo and coronary CT were ordered. Echo showed normal LVEF and normal diastolic function. Cardiac CT has not been performed yet.   Today, the echo was reviewed. CT is scheduled for later this month. She feels chest pain was from anxiety. She denies further chest pain or shortness of breath. No lower leg edema, lightheadedness, dizziness, palpitations. She does no formal activity.   Past Medical History:  Diagnosis Date   Hypertension    Sleep apnea     Past Surgical History:  Procedure Laterality Date   COLONOSCOPY WITH PROPOFOL N/A 10/26/2021   Procedure: COLONOSCOPY WITH PROPOFOL;  Surgeon: Regis Bill, MD;  Location: [facility] ENDOSCOPY;  Service: Endoscopy;  Laterality: N/A;   DILATION AND CURETTAGE OF UTERUS     TUBAL LIGATION      Current Medications: Current Meds  Medication Sig   hydrochlorothiazide (HYDRODIURIL) 12.5 MG tablet Take 12.5 mg by mouth daily.   losartan (COZAAR) 50 MG tablet Take 50 mg by mouth daily.   metoprolol tartrate (LOPRESSOR) 100 MG tablet Take 100 mg by mouth. Take 1 tablet by mouth once for 1 dose. TAKE 2 HOURS PRIOR TO CARDIAC CT.     Allergies:   Patient has no known allergies.   Social History   Socioeconomic History   Marital status: Married    Spouse name: Not on file    Number of children: Not on file   Years of education: Not on file   Highest education level: Not on file  Occupational History   Not on file  Tobacco Use   Smoking status: Never   Smokeless tobacco: Never  Vaping Use   Vaping Use: Never used  Substance and Sexual Activity   Alcohol use: Never   Drug use: Never   Sexual activity: Not on file  Other Topics Concern   Not on file  Social History Narrative   Not on file   Social Determinants of Health   Financial Resource Strain: Not on file  Food Insecurity: Not on file  Transportation Needs: Not on file  Physical Activity: Not on file  Stress: Not on file  Social Connections: Not on file     Family History: The patient's family history includes Heart failure in her mother; Hypertension in her father, mother, and sister. There is no history of Breast cancer.  ROS:   Please see the history of present illness.     All other systems reviewed and are negative.  EKGs/Labs/Other Studies Reviewed:    The following studies were reviewed today:  Echo 02/2023 1. Left ventricular ejection fraction, by estimation, is 60 to 65%. The  left ventricle has normal function. The left ventricle has no regional  wall motion abnormalities. Left ventricular diastolic parameters were  normal.   2.  Right ventricular systolic function is normal. The right ventricular  size is normal. Tricuspid regurgitation signal is inadequate for assessing  PA pressure.   3. The mitral valve is normal in structure. No evidence of mitral valve  regurgitation. No evidence of mitral stenosis.   4. The aortic valve is tricuspid. Aortic valve regurgitation is not  visualized. No aortic stenosis is present.   5. The inferior vena cava is normal in size with greater than 50%  respiratory variability, suggesting right atrial pressure of 3 mmHg.   EKG:  EKG is not ordered today.    Recent Labs: 02/18/2023: BUN 14; Creatinine, Ser 0.97; Hemoglobin 13.2; Platelets  280; Potassium 3.2; Sodium 139  Recent Lipid Panel No results found for: "CHOL", "TRIG", "HDL", "CHOLHDL", "VLDL", "LDLCALC", "LDLDIRECT"   Physical Exam:    VS:  BP (!) 120/90 (BP Location: Left Arm, Patient Position: Sitting, Cuff Size: Large)   Pulse 69   Ht 5\' 7"  (1.702 m)   Wt 220 lb 6 oz (100 kg)   LMP  (LMP Unknown)   SpO2 98%   BMI 34.52 kg/m     Wt Readings from Last 3 Encounters:  03/14/23 220 lb 6 oz (100 kg)  02/23/23 218 lb 12.8 oz (99.2 kg)  02/18/23 218 lb (98.9 kg)     GEN:  Well nourished, well developed in no acute distress HEENT: Normal NECK: No JVD; No carotid bruits LYMPHATICS: No lymphadenopathy CARDIAC: RRR, no murmurs, rubs, gallops RESPIRATORY:  Clear to auscultation without rales, wheezing or rhonchi  ABDOMEN: Soft, non-tender, non-distended MUSCULOSKELETAL:  No edema; No deformity  SKIN: Warm and dry NEUROLOGIC:  Alert and oriented x 3 PSYCHIATRIC:  Normal affect   ASSESSMENT:    1. Precordial pain   2. Essential hypertension   3. Hypokalemia    PLAN:    In order of problems listed above:  Chest pain Chest pain in the setting of recent emotional stressor at home (husband died of MI in his sleep). Echo was overall normal. Cardiac CTA scheduled for later this month. Patient denies further chest pain. Further recommendations pending cardiac CT. Recommend repeat lipid panel at PCP visit in August.   HTN BP is normal today, continue hydrochlorothiazide and Losartan.   Hypokalemia K3.2. Recheck BMET at PCP visit next month  Disposition: Follow up in 6 month(s) with MD/APP    Signed, Santos Sollenberger David Stall, PA-C  03/14/2023 10:33 AM    [facility]

## 2023-03-14 NOTE — Patient Instructions (Signed)
Medication Instructions:  Your physician recommends that you continue on your current medications as directed. Please refer to the Current Medication list given to you today.  *If you need a refill on your cardiac medications before your next appointment, please call your pharmacy*   Lab Work: None ordered today   Testing/Procedures: None ordered today   Follow-Up: At [facility], you and your health needs are our priority.  As part of our continuing mission to provide you with exceptional heart care, we have created designated Provider Care Teams.  These Care Teams include your primary Cardiologist (physician) and Advanced Practice Providers (APPs -  Physician Assistants and Nurse Practitioners) who all work together to provide you with the care you need, when you need it.  We recommend signing up for the patient portal called "MyChart".  Sign up information is provided on this After Visit Summary.  MyChart is used to connect with patients for Virtual Visits (Telemedicine).  Patients are able to view lab/test results, encounter notes, upcoming appointments, etc.  Non-urgent messages can be sent to your provider as well.   To learn more about what you can do with MyChart, go to [email or web address].com.    Your next appointment:   6 month(s)  Provider:   You may see Brian Agbor-Etang, MD or one of the following Advanced Practice Providers on your designated Care Team:   Christopher Berge, NP Ryan Dunn, PA-C Cadence Furth, PA-C Sheri Hammock, NP    

## 2023-03-29 ENCOUNTER — Encounter (HOSPITAL_COMMUNITY): Payer: Self-pay

## 2023-03-30 ENCOUNTER — Telehealth (HOSPITAL_COMMUNITY): Payer: Self-pay | Admitting: *Deleted

## 2023-03-30 NOTE — Telephone Encounter (Signed)
Attempted to call patient regarding upcoming cardiac CT appointment. °Left message on voicemail with name and callback number ° °Merle Prescott RN Navigator Cardiac Imaging °[facility] °[phone] Office °[phone] Cell ° °

## 2023-03-31 ENCOUNTER — Ambulatory Visit
Admission: RE | Admit: 2023-03-31 | Discharge: 2023-03-31 | Disposition: A | Discharge status: Home / Self Care | Payer: 59 | Source: Ambulatory Visit | Attending: Cardiology | Admitting: Cardiology

## 2023-03-31 DIAGNOSIS — R072 Precordial pain: Secondary | ICD-10-CM | POA: Diagnosis present

## 2023-03-31 MED ORDER — NITROGLYCERIN 0.4 MG SL SUBL
0.8000 mg | SUBLINGUAL_TABLET | Freq: Once | SUBLINGUAL | Status: AC
  Administered 2023-03-31: 0.8 mg via SUBLINGUAL

## 2023-03-31 MED ORDER — IOHEXOL 350 MG/ML SOLN
100.0000 mL | Freq: Once | INTRAVENOUS | Status: AC | PRN
  Administered 2023-03-31: 100 mL via INTRAVENOUS

## 2023-03-31 MED ORDER — SODIUM CHLORIDE 0.9 % IV BOLUS
150.0000 mL | Freq: Once | INTRAVENOUS | Status: AC
  Administered 2023-03-31: 150 mL via INTRAVENOUS

## 2023-03-31 NOTE — Progress Notes (Signed)
Patient tolerated CT well. Drank water after. Vital signs stable encourage to drink water throughout day.Reasons explained and verbalized understanding. Ambulated steady gait.  

## 2023-11-29 ENCOUNTER — Encounter: Payer: Self-pay | Admitting: Medical

## 2023-11-29 ENCOUNTER — Ambulatory Visit: Payer: 59 | Attending: Medical | Admitting: Medical

## 2023-11-29 VITALS — BP 124/72 | HR 68 | Ht 66.0 in | Wt 229.0 lb

## 2023-11-29 DIAGNOSIS — I10 Essential (primary) hypertension: Secondary | ICD-10-CM | POA: Diagnosis not present

## 2023-11-29 DIAGNOSIS — I2510 Atherosclerotic heart disease of native coronary artery without angina pectoris: Secondary | ICD-10-CM

## 2023-11-29 DIAGNOSIS — R5383 Other fatigue: Secondary | ICD-10-CM

## 2023-11-29 NOTE — Patient Instructions (Signed)
 Medication Instructions:  Your physician recommends that you continue on your current medications as directed. Please refer to the Current Medication list given to you today.   *If you need a refill on your cardiac medications before your next appointment, please call your pharmacy*   Lab Work: No labs ordered today    Testing/Procedures: No test ordered today    Follow-Up: At [facility], you and your health needs are our priority.  As part of our continuing mission to provide you with exceptional heart care, we have created designated Provider Care Teams.  These Care Teams include your primary Cardiologist (physician) and Advanced Practice Providers (APPs -  Physician Assistants and Nurse Practitioners) who all work together to provide you with the care you need, when you need it.  We recommend signing up for the patient portal called "MyChart".  Sign up information is provided on this After Visit Summary.  MyChart is used to connect with patients for Virtual Visits (Telemedicine).  Patients are able to view lab/test results, encounter notes, upcoming appointments, etc.  Non-urgent messages can be sent to your provider as well.   To learn more about what you can do with MyChart, go to [email or web address].    Your next appointment:   1 year(s)  Provider:   You may see Debbe Odea, MD or one of the following Advanced Practice Providers on your designated Care Team:   Nicolasa Ducking, NP Eula Listen, PA-C Cadence Fransico Michael, PA-C Charlsie Quest, NP Carlos Levering, NP

## 2023-11-29 NOTE — Progress Notes (Signed)
 Cardiology Office Note:  .   Date:  11/29/2023  ID:  Laura Rodgers, DOB 1970-11-17, MRN [id number] PCP: Kandyce Rud, MD  [facility] Providers Cardiologist:  Debbe Odea, MD {   History of Present Illness: .   Laura Rodgers is a 53 y.o. female with a hx of hypertension, nonobstructive CAD who presents for 34-month follow-up for CAD.   Patient was seen in July 2024 as a new patient for chest pain.  Patient was previously evaluated in the ED 02/18/2023 with nonspecific chest pain, believe the chest pain was due to emotional stress with loss of husband.   Echo showed normal LVEF and normal diastolic function. Cardiac CT showed coronary calcium score 17.1, 90th percentile, minimal stenosis in the LAD and RCA less than 25%, overall minimal nonobstructive CAD.  Today, the patient reports she is overall doing better. She is feeling mood is overall better. She does feel fatigued and more tired. She works from home and says life is very busy. She denies chest pain, SOB, lower leg edema. Had some dizziness at the beginning of the month thought to be fluid in her ears and high BP.  She has a treadmill and bike at home and she is trying to exercise more, but energy is low.   Studies Reviewed: Marland Kitchen   EKG Interpretation Date/Time:  Tuesday November 29 2023 08:58:58 EDT Ventricular Rate:  68 PR Interval:  180 QRS Duration:  76 QT Interval:  428 QTC Calculation: 455 R Axis:   40  Text Interpretation: Normal sinus rhythm Possible Lateral infarct , age undetermined When compared with ECG of 18-Feb-2023 11:49, Borderline criteria for Lateral infarct are now Present Confirmed by Fransico Michael, Juanantonio Stolar ([id number]) on 11/29/2023 9:06:09 AM    Cardiac CTA 03/2023 IMPRESSION: 1. Coronary calcium score of 17.1. This was 90th percentile for age and sex matched control.   2. Normal coronary origin with right dominance.   3. Minimal stenosis of LAD and RCA (<25%).   4. CAD-RADS 1. Minimal non-obstructive CAD  (0-24%). Consider non-atherosclerotic causes of chest pain. Consider preventive therapy and risk factor modification.    Echo 02/2023 1. Left ventricular ejection fraction, by estimation, is 60 to 65%. The  left ventricle has normal function. The left ventricle has no regional  wall motion abnormalities. Left ventricular diastolic parameters were  normal.   2. Right ventricular systolic function is normal. The right ventricular  size is normal. Tricuspid regurgitation signal is inadequate for assessing  PA pressure.   3. The mitral valve is normal in structure. No evidence of mitral valve  regurgitation. No evidence of mitral stenosis.   4. The aortic valve is tricuspid. Aortic valve regurgitation is not  visualized. No aortic stenosis is present.   5. The inferior vena cava is normal in size with greater than 50%  respiratory variability, suggesting right atrial pressure of 3 mmHg.      Physical Exam:   VS:  BP 124/72   Pulse 68   Ht 5\' 6"  (1.676 m)   Wt 229 lb (103.9 kg)   LMP  (LMP Unknown)   SpO2 100%   BMI 36.96 kg/m    Wt Readings from Last 3 Encounters:  11/29/23 229 lb (103.9 kg)  03/14/23 220 lb 6 oz (100 kg)  02/23/23 218 lb 12.8 oz (99.2 kg)    GEN: Well nourished, well developed in no acute distress NECK: No JVD; No carotid bruits CARDIAC: RRR, no murmurs, rubs, gallops RESPIRATORY:  Clear to auscultation  without rales, wheezing or rhonchi  ABDOMEN: Soft, non-tender, non-distended EXTREMITIES:  No edema; No deformity   ASSESSMENT AND PLAN: .    HTN BP today is good. PCP increased hydrochlorothiazide and Losartan. Continue hydrochlorothiazide 25mg  daily and Losartan 100mg  daily.  Fatigue Reports generalized fatigue and overall tiredness. She works full-time and stays very busy. Unsure if she is sleeping well. Said PCP is aware and will order labs. EKG today shows NSR. She denies chest pain, SOB, lightheadedness, dizziness, palpitations. She feels mood is  overall improving and is trying to work out more.     Nonobstructive CAD Cardiac CTA in 2024 showed minimal nonobstructive CAD. She denies anginal symptoms. She is trying to be more active. Says diet is good. Continue primary prevention.   Dispo: Follow-up in 1 year.   Signed, Ciearra Rufo David Stall, PA-C

## 2024-03-06 ENCOUNTER — Other Ambulatory Visit: Payer: Self-pay | Admitting: Family Medicine

## 2024-03-06 DIAGNOSIS — Z1231 Encounter for screening mammogram for malignant neoplasm of breast: Secondary | ICD-10-CM

## 2024-03-13 ENCOUNTER — Ambulatory Visit
Admission: RE | Admit: 2024-03-13 | Discharge: 2024-03-13 | Disposition: A | Discharge status: Home / Self Care | Source: Ambulatory Visit | Attending: Family Medicine | Admitting: Family Medicine

## 2024-03-13 DIAGNOSIS — Z1231 Encounter for screening mammogram for malignant neoplasm of breast: Secondary | ICD-10-CM | POA: Diagnosis present

## 2024-03-20 ENCOUNTER — Other Ambulatory Visit: Payer: Self-pay | Admitting: Family Medicine

## 2024-03-20 DIAGNOSIS — R928 Other abnormal and inconclusive findings on diagnostic imaging of breast: Secondary | ICD-10-CM

## 2024-03-21 ENCOUNTER — Ambulatory Visit
Admission: RE | Admit: 2024-03-21 | Discharge: 2024-03-21 | Disposition: A | Discharge status: Home / Self Care | Source: Ambulatory Visit | Attending: Family Medicine | Admitting: Family Medicine

## 2024-03-21 ENCOUNTER — Inpatient Hospital Stay
Admission: RE | Admit: 2024-03-21 | Discharge: 2024-03-21 | Discharge status: Home / Self Care | Source: Ambulatory Visit | Attending: Family Medicine | Admitting: Family Medicine

## 2024-03-21 DIAGNOSIS — R928 Other abnormal and inconclusive findings on diagnostic imaging of breast: Secondary | ICD-10-CM
